# Patient Record
Sex: Female | Born: 1965 | Race: White | Hispanic: No | Marital: Married | Smoking: Never smoker
Health system: Southern US, Community
[De-identification: ages and names within clinical notes are randomized; demographics above are authoritative.]

---

## 1997-08-06 ENCOUNTER — Encounter: Admission: RE | Admit: 1997-08-06 | Discharge: 1997-08-06 | Payer: Self-pay | Admitting: Family Medicine

## 1997-08-12 ENCOUNTER — Ambulatory Visit (HOSPITAL_COMMUNITY): Admission: RE | Admit: 1997-08-12 | Discharge: 1997-08-12 | Payer: Self-pay | Admitting: Family Medicine

## 1997-08-16 ENCOUNTER — Encounter: Admission: RE | Admit: 1997-08-16 | Discharge: 1997-11-14 | Payer: Self-pay | Admitting: Family Medicine

## 1997-09-05 ENCOUNTER — Encounter: Admission: RE | Admit: 1997-09-05 | Discharge: 1997-09-05 | Payer: Self-pay | Admitting: Family Medicine

## 1997-10-20 ENCOUNTER — Emergency Department (HOSPITAL_COMMUNITY): Admission: EM | Admit: 1997-10-20 | Discharge: 1997-10-20 | Payer: Self-pay | Admitting: Emergency Medicine

## 1998-01-17 ENCOUNTER — Encounter: Admission: RE | Admit: 1998-01-17 | Discharge: 1998-01-17 | Payer: Self-pay | Admitting: Family Medicine

## 1998-01-18 ENCOUNTER — Encounter: Admission: RE | Admit: 1998-01-18 | Discharge: 1998-01-18 | Payer: Self-pay | Admitting: Family Medicine

## 1998-02-13 ENCOUNTER — Encounter: Admission: RE | Admit: 1998-02-13 | Discharge: 1998-02-13 | Payer: Self-pay | Admitting: Family Medicine

## 1998-05-24 ENCOUNTER — Encounter: Admission: RE | Admit: 1998-05-24 | Discharge: 1998-05-24 | Payer: Self-pay | Admitting: Family Medicine

## 1998-12-15 ENCOUNTER — Emergency Department (HOSPITAL_COMMUNITY): Admission: EM | Admit: 1998-12-15 | Discharge: 1998-12-15 | Payer: Self-pay | Admitting: Emergency Medicine

## 1999-01-29 ENCOUNTER — Encounter: Admission: RE | Admit: 1999-01-29 | Discharge: 1999-01-29 | Payer: Self-pay | Admitting: Family Medicine

## 1999-05-13 ENCOUNTER — Encounter: Admission: RE | Admit: 1999-05-13 | Discharge: 1999-05-13 | Payer: Self-pay | Admitting: Sports Medicine

## 1999-08-16 ENCOUNTER — Emergency Department (HOSPITAL_COMMUNITY): Admission: EM | Admit: 1999-08-16 | Discharge: 1999-08-16 | Payer: Self-pay | Admitting: Emergency Medicine

## 1999-08-16 ENCOUNTER — Encounter: Payer: Self-pay | Admitting: Emergency Medicine

## 2000-04-24 ENCOUNTER — Ambulatory Visit (HOSPITAL_BASED_OUTPATIENT_CLINIC_OR_DEPARTMENT_OTHER): Admission: RE | Admit: 2000-04-24 | Discharge: 2000-04-24 | Payer: Self-pay | Admitting: Otolaryngology

## 2001-05-25 ENCOUNTER — Ambulatory Visit (HOSPITAL_COMMUNITY): Admission: RE | Admit: 2001-05-25 | Discharge: 2001-05-25 | Payer: Self-pay | Admitting: Family Medicine

## 2001-05-25 ENCOUNTER — Encounter: Payer: Self-pay | Admitting: Family Medicine

## 2002-11-18 ENCOUNTER — Emergency Department (HOSPITAL_COMMUNITY): Admission: EM | Admit: 2002-11-18 | Discharge: 2002-11-18 | Payer: Self-pay | Admitting: Emergency Medicine

## 2007-08-18 ENCOUNTER — Ambulatory Visit: Payer: Self-pay

## 2007-09-19 ENCOUNTER — Ambulatory Visit: Payer: Self-pay | Admitting: Internal Medicine

## 2007-09-30 ENCOUNTER — Ambulatory Visit: Payer: Self-pay | Admitting: Internal Medicine

## 2007-10-19 ENCOUNTER — Ambulatory Visit: Payer: Self-pay | Admitting: Internal Medicine

## 2008-01-02 ENCOUNTER — Other Ambulatory Visit: Payer: Self-pay

## 2008-01-03 ENCOUNTER — Inpatient Hospital Stay: Payer: Self-pay | Admitting: Internal Medicine

## 2008-01-03 IMAGING — US ABDOMEN ULTRASOUND
1 series · 17 of 25 positions shown · non-contrast
Comparison: none

REASON FOR EXAM: RUQ pain
COMMENTS:

[Series 1: abdomen ultrasound · 17 of 48 slices shown]
[im 1/48]
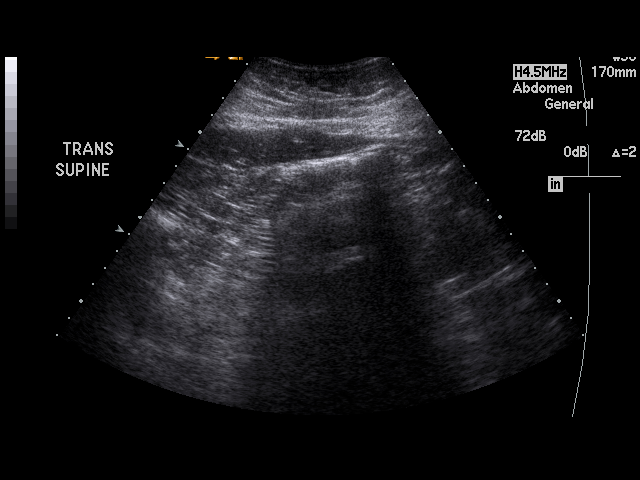
[im 4/48]
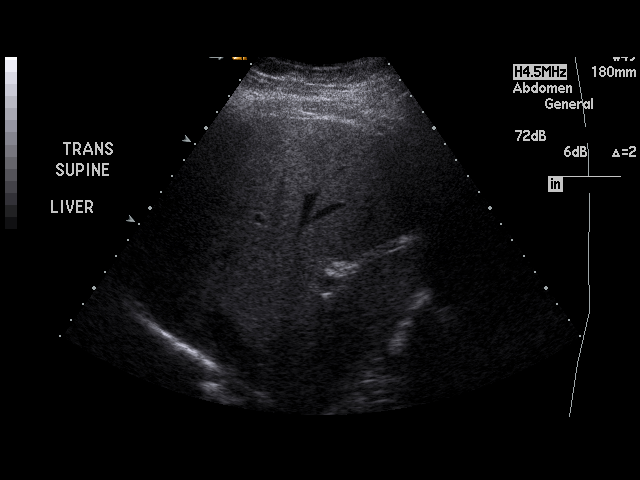
[im 6/48]
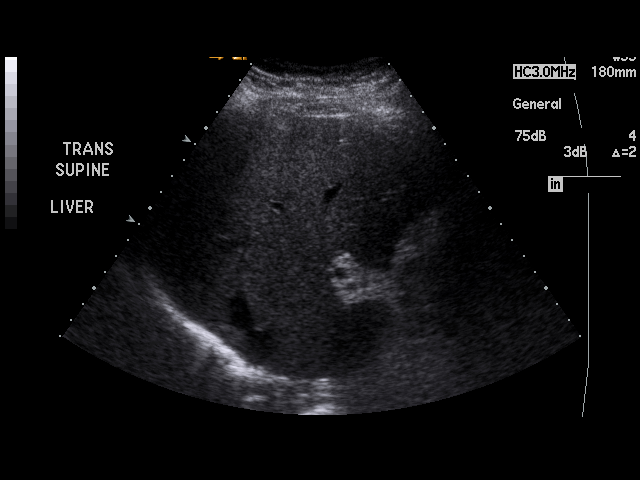
[im 10/48]
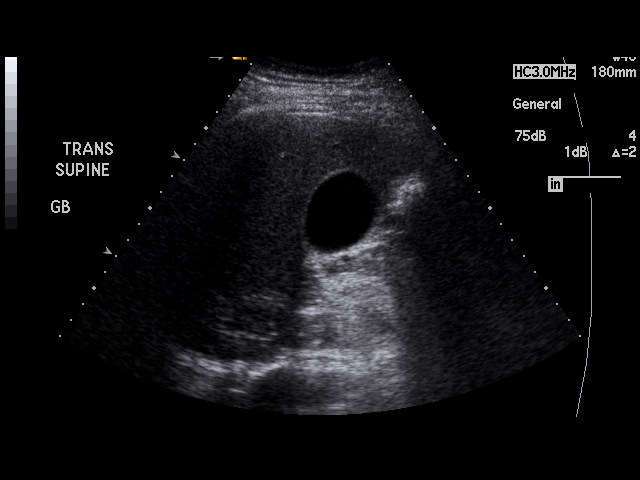
[im 12/48]
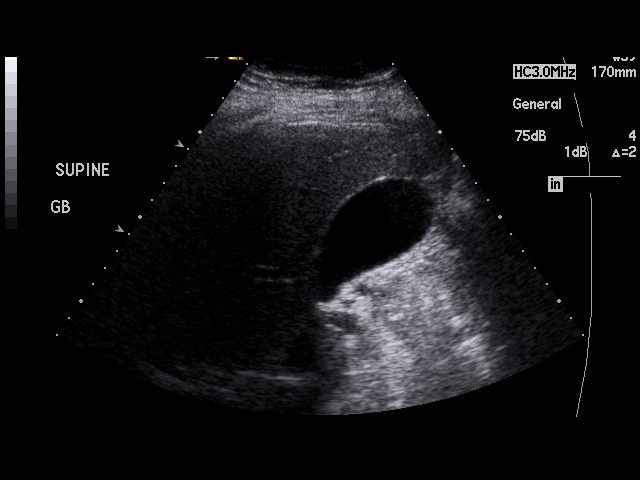
[im 16/48]
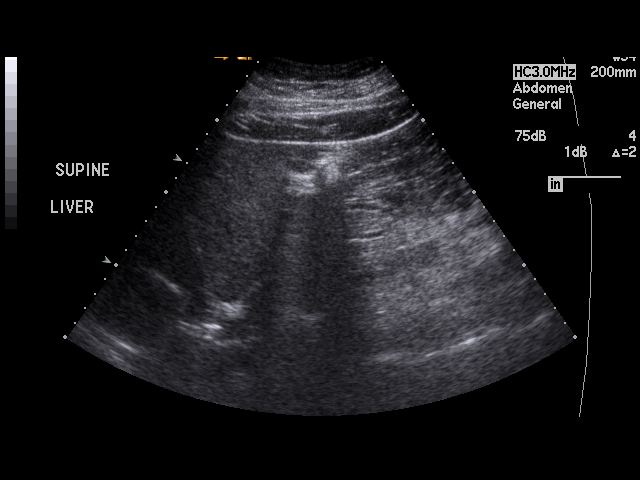
[im 18/48]
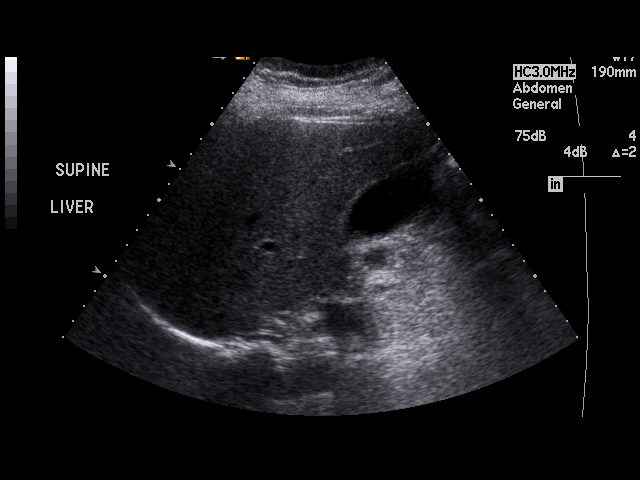
[im 22/48]
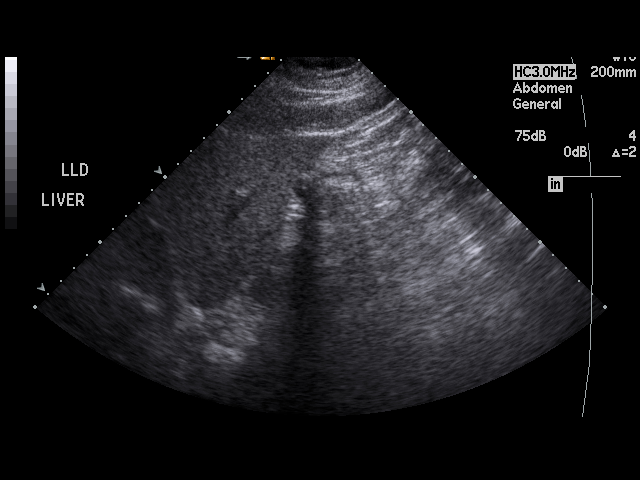
[im 24/48]
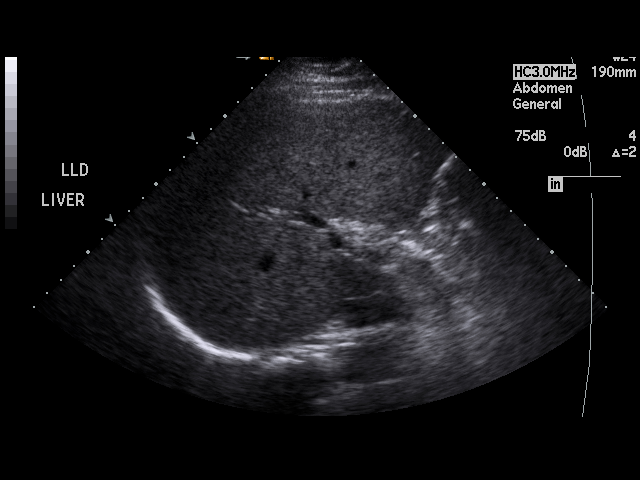
[im 26/48]
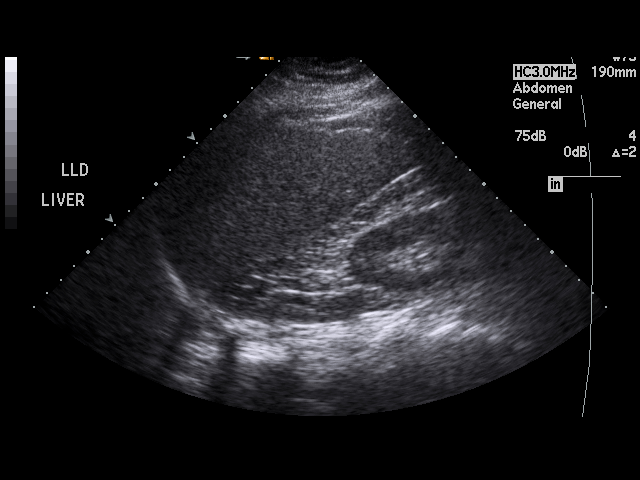
[im 30/48]
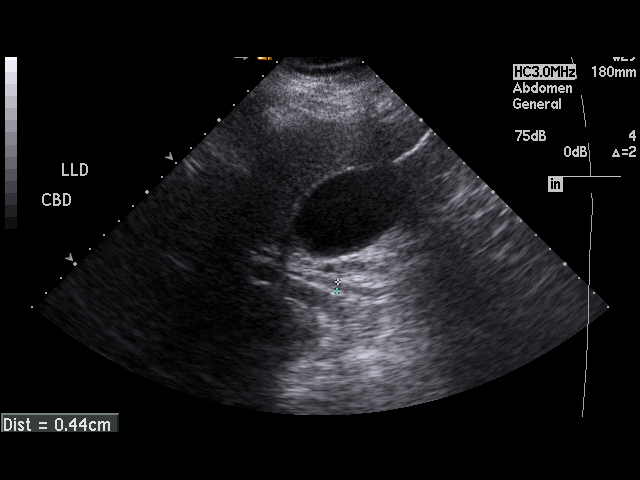
[im 32/48]
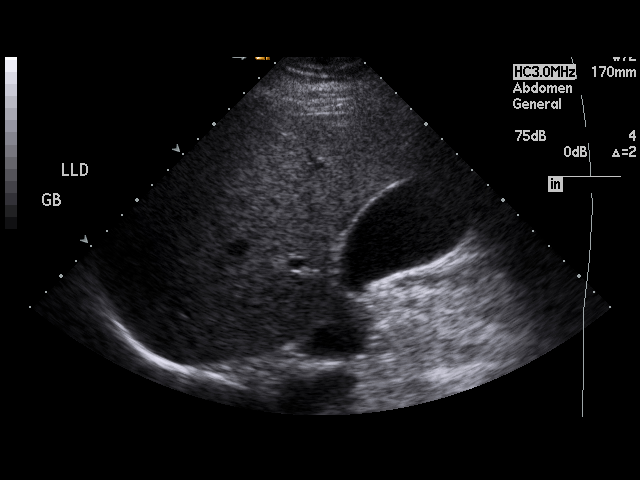
[im 36/48]
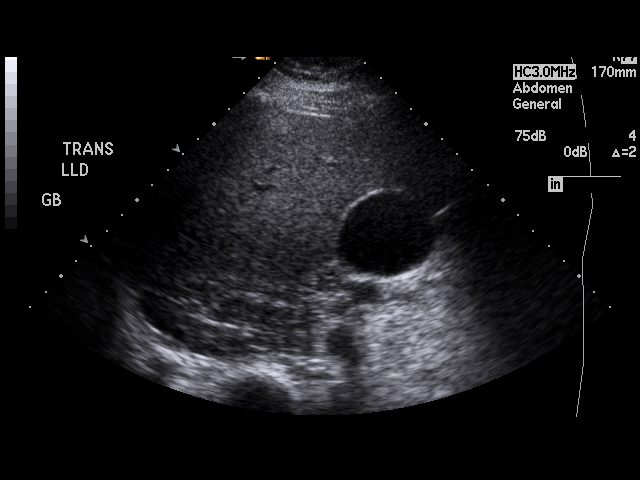
[im 38/48]
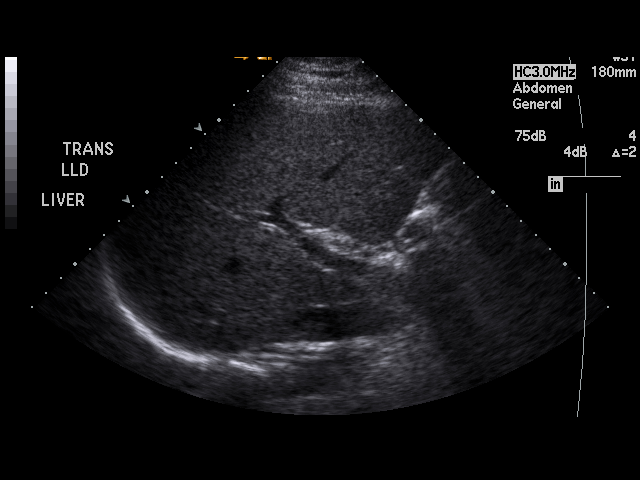
[im 42/48]
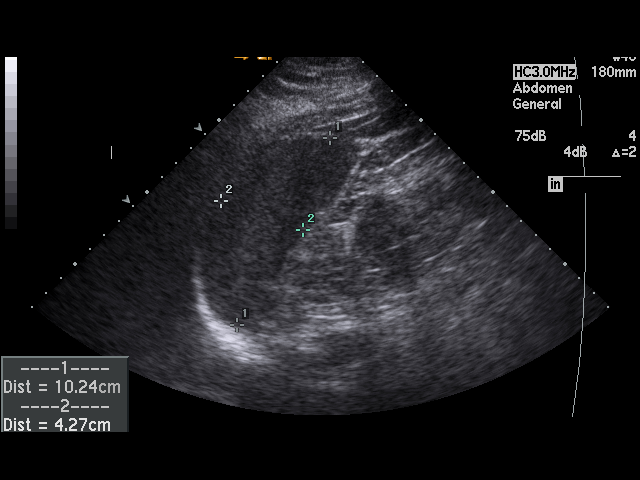
[im 44/48]
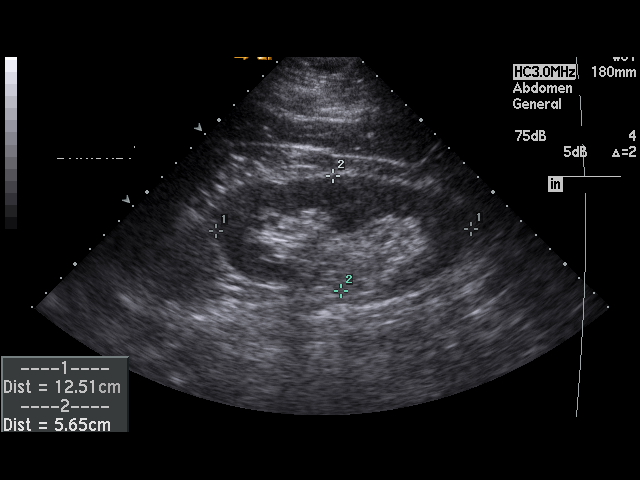
[im 48/48]
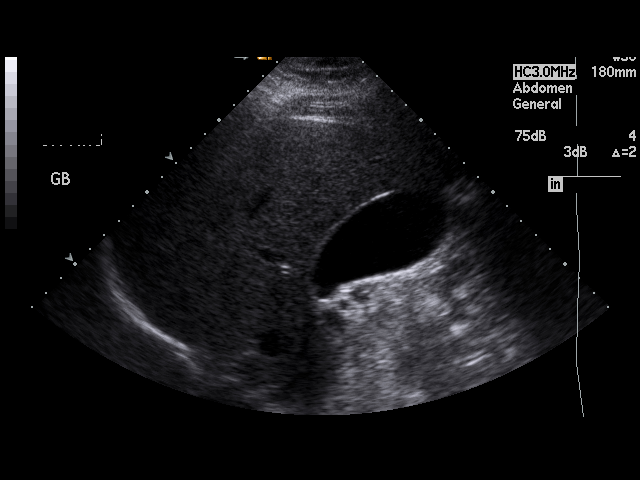

[17 of 25 positions shown; findings below may reference images not displayed]

PROCEDURE:     US  - US ABDOMEN GENERAL SURVEY  - [DATE] [DATE]

RESULT:     The liver demonstrates a homogeneous echotexture.  Hepatopetal
flow is demonstrated within the portal vein.  Evaluation of the gallbladder
demonstrates no evidence of pericholecystic fluid nor evidence of gallstones
nor sludging.  There is no evidence of intra or extrahepatic biliary ductal
dilatation.  Evaluation of the kidneys demonstrates no evidence of
hydronephrosis, masses or calculi.  The inferior vena cava and aorta are
unremarkable.  Evaluation of the kidneys demonstrates dimensions of 12.33 x
4.88 on the RIGHT and 12.51 x 5.65 on the LEFT.  There is no evidence of
hydronephrosis, masses nor calculi.  The pancreas is not visualized.
IMPRESSION: Unremarkable abdominal ultrasound as described above.

Dr. VS, of the emergency department, was informed of these findings at
the time of the initial interpretation.

## 2013-08-01 ENCOUNTER — Ambulatory Visit: Payer: Self-pay | Admitting: General Practice

## 2013-08-04 ENCOUNTER — Ambulatory Visit: Payer: Self-pay | Admitting: General Practice

## 2013-08-07 LAB — PATHOLOGY REPORT

## 2016-02-17 ENCOUNTER — Encounter: Payer: Self-pay | Admitting: Emergency Medicine

## 2016-02-17 ENCOUNTER — Emergency Department
Admission: EM | Admit: 2016-02-17 | Discharge: 2016-02-17 | Disposition: A | Discharge status: Home / Self Care | Payer: Self-pay | Attending: Emergency Medicine | Admitting: Emergency Medicine

## 2016-02-17 ENCOUNTER — Emergency Department: Payer: Self-pay

## 2016-02-17 DIAGNOSIS — B9789 Other viral agents as the cause of diseases classified elsewhere: Secondary | ICD-10-CM

## 2016-02-17 DIAGNOSIS — J069 Acute upper respiratory infection, unspecified: Secondary | ICD-10-CM | POA: Insufficient documentation

## 2016-02-17 MED ORDER — ALBUTEROL SULFATE HFA 108 (90 BASE) MCG/ACT IN AERS: 2.0000 | INHALATION_SPRAY | RESPIRATORY_TRACT | 0 refills | Status: DC | PRN

## 2016-02-17 MED ORDER — HYDROCOD POLST-CPM POLST ER 10-8 MG/5ML PO SUER: 5.0000 mL | Freq: Two times a day (BID) | ORAL | 0 refills | Status: DC

## 2016-02-17 MED ORDER — HYDROCOD POLST-CPM POLST ER 10-8 MG/5ML PO SUER
5.0000 mL | Freq: Once | ORAL | Status: AC
  Administered 2016-02-17: 5 mL via ORAL
  Filled 2016-02-17: qty 5

## 2016-02-17 NOTE — ED Provider Notes (Signed)
[facility] Emergency Department Provider Note   ____________________________________________   First MD Initiated Contact with Patient 02/17/16 [phone]     (approximate)  I have reviewed the triage vital signs and the nursing notes.   HISTORY  Chief Complaint Cough and Nasal Congestion    HPI Stephanie LangoVanessa P Livingston is a 50 y.o. female who presents to the ED from home with a chief complain of cough and runny nose.Reports onset of symptoms over a week ago. Had fever as high as 102.23F 3 days ago; none since. Describes cough productive of yellow sputum which is persistent and keeps her up at night. Symptoms associated with chest wall soreness. Patient reports she has tried various over-the-counter medications without relief of her symptoms. Denies associated shortness of breath, abdominal pain, nausea, vomiting, diarrhea. Denies recent travel or trauma.   Past medical history None  There are no active problems to display for this patient.   History reviewed. No pertinent surgical history.  Prior to Admission medications   Medication Sig Start Date End Date Taking? Authorizing Provider  albuterol (PROVENTIL HFA;VENTOLIN HFA) 108 (90 Base) MCG/ACT inhaler Inhale 2 puffs into the lungs every 4 (four) hours as needed for wheezing or shortness of breath. 02/17/16   Irean HongJade J Efe Fazzino, MD  chlorpheniramine-HYDROcodone (TUSSIONEX PENNKINETIC ER) 10-8 MG/5ML SUER Take 5 mLs by mouth 2 (two) times daily. 02/17/16   Irean HongJade J Yaris Ferrell, MD    Allergies Morphine and related and Inderal [propranolol]  No family history on file.  Social History Social History  Substance Use Topics  . Smoking status: Not on file  . Smokeless tobacco: Not on file  . Alcohol use Not on file  Nonsmoker  Review of Systems  Constitutional: No fever/chills. Eyes: No visual changes. ENT: No sore throat. Cardiovascular: Denies chest pain. Respiratory: Positive for persistent, productive cough. Denies  shortness of breath. Gastrointestinal: No abdominal pain.  No nausea, no vomiting.  No diarrhea.  No constipation. Genitourinary: Negative for dysuria. Musculoskeletal: Negative for back pain. Skin: Negative for rash. Neurological: Negative for headaches, focal weakness or numbness.  10-point ROS otherwise negative.  ____________________________________________   PHYSICAL EXAM:  VITAL SIGNS: ED Triage Vitals  Enc Vitals Group     BP 02/17/16 0308 (!) 154/101     Pulse Rate 02/17/16 0308 (!) 114     Resp 02/17/16 0308 20     Temp 02/17/16 0308 99.1 F (37.3 C)     Temp Source 02/17/16 0308 Oral     SpO2 --      Weight 02/17/16 0307 220 lb (99.8 kg)     Height 02/17/16 0307 5\' 4"  (1.626 m)     Head Circumference --      Peak Flow --      Pain Score 02/17/16 0305 0     Pain Loc --      Pain Edu? --      Excl. in GC? --     Constitutional: Alert and oriented. Well appearing and in mild acute distress. Eyes: Conjunctivae are normal. PERRL. EOMI. Head: Atraumatic. Nose: No congestion/rhinnorhea. Mouth/Throat: Mucous membranes are moist.  Oropharynx non-erythematous. Neck: No stridor.   Cardiovascular: Normal rate, regular rhythm. Grossly normal heart sounds.  Good peripheral circulation. Respiratory: Normal respiratory effort.  No retractions. Splinting. Lungs CTAB. Anterior chest wall tenderness to palpation. Gastrointestinal: Soft and nontender. No distention. No abdominal bruits. No CVA tenderness. Musculoskeletal: No lower extremity tenderness nor edema.  No joint effusions. Neurologic:  Normal speech  and language. No gross focal neurologic deficits are appreciated. No gait instability. Skin:  Skin is warm, dry and intact. No rash noted. Psychiatric: Mood and affect are normal. Speech and behavior are normal.  ____________________________________________   LABS (all labs ordered are listed, but only abnormal results are displayed)  Labs Reviewed - No data to  display ____________________________________________  EKG  None ____________________________________________  RADIOLOGY  Chest 2 view (viewed by me, interpreted per Dr. Clovis RileyMitchell): No active cardiopulmonary disease. ____________________________________________   PROCEDURES  Procedure(s) performed: None  Procedures  Critical Care performed: No  ____________________________________________   INITIAL IMPRESSION / ASSESSMENT AND PLAN / ED COURSE  Pertinent labs & imaging results that were available during my care of the patient were reviewed by me and considered in my medical decision making (see chart for details).  50 -year-old female who presents with over a week's worth of viral URI symptoms. X-rays negative for pneumonia. Will place on Tussionex and inhaler as needed; patient will follow-up with her PCP this week. Strict return precautions given. Patient verbalizes understanding and agrees with plan of care.  Clinical Course     ____________________________________________   FINAL CLINICAL IMPRESSION(S) / ED DIAGNOSES  Final diagnoses:  Viral URI with cough      NEW MEDICATIONS STARTED DURING THIS VISIT:  New Prescriptions   ALBUTEROL (PROVENTIL HFA;VENTOLIN HFA) 108 (90 BASE) MCG/ACT INHALER    Inhale 2 puffs into the lungs every 4 (four) hours as needed for wheezing or shortness of breath.   CHLORPHENIRAMINE-HYDROCODONE (TUSSIONEX PENNKINETIC ER) 10-8 MG/5ML SUER    Take 5 mLs by mouth 2 (two) times daily.     Note:  This document was prepared using Dragon voice recognition software and may include unintentional dictation errors.    Irean HongJade J Marquia Costello, MD 02/17/16 [phone]

## 2016-02-17 NOTE — Discharge Instructions (Signed)
1. You may take Tussionex as needed for cough. 2. Use albuterol inhaler 2 puffs every 4 hours as needed for wheezing. 3. Return to the ER for worsening symptoms, persistent vomiting, difficulty breathing or other concerns.

## 2016-02-17 NOTE — ED Notes (Signed)
Pt reports having cough and runny nose for over a week.  Pt reports having fever of 102.4 last Friday, but no fever since.  Pt states that she cannot get rid of her cough and that she cannot sleep because of it.  Pt states she has tried robitussin DM, tylenol, mucinex and dayquil to relieve symptoms with no relief.

## 2021-02-25 ENCOUNTER — Other Ambulatory Visit: Payer: Self-pay

## 2021-02-25 ENCOUNTER — Emergency Department: Payer: BC Managed Care – PPO | Admitting: Anesthesiology

## 2021-02-25 ENCOUNTER — Encounter
Admission: EM | Disposition: A | Discharge status: Home / Self Care | Payer: Self-pay | Source: Home / Self Care | Attending: Emergency Medicine

## 2021-02-25 ENCOUNTER — Emergency Department: Payer: BC Managed Care – PPO

## 2021-02-25 ENCOUNTER — Observation Stay
Admission: EM | Admit: 2021-02-25 | Discharge: 2021-02-26 | Disposition: A | Discharge status: Home / Self Care | Payer: BC Managed Care – PPO | Attending: Surgery | Admitting: Surgery

## 2021-02-25 DIAGNOSIS — K439 Ventral hernia without obstruction or gangrene: Secondary | ICD-10-CM | POA: Diagnosis not present

## 2021-02-25 DIAGNOSIS — Z886 Allergy status to analgesic agent status: Secondary | ICD-10-CM | POA: Insufficient documentation

## 2021-02-25 DIAGNOSIS — Z20822 Contact with and (suspected) exposure to covid-19: Secondary | ICD-10-CM | POA: Insufficient documentation

## 2021-02-25 DIAGNOSIS — K436 Other and unspecified ventral hernia with obstruction, without gangrene: Secondary | ICD-10-CM | POA: Diagnosis present

## 2021-02-25 HISTORY — PX: VENTRAL HERNIA REPAIR: SHX424

## 2021-02-25 HISTORY — PX: INSERTION OF MESH: SHX5868

## 2021-02-25 LAB — CBC WITH DIFFERENTIAL/PLATELET
Abs Immature Granulocytes: 0.05 10*3/uL (ref 0.00–0.07)
Basophils Absolute: 0.1 10*3/uL (ref 0.0–0.1)
Basophils Relative: 1 %
Eosinophils Absolute: 0.1 10*3/uL (ref 0.0–0.5)
Eosinophils Relative: 1 %
HCT: 43.3 % (ref 36.0–46.0)
Hemoglobin: 15.2 g/dL — ABNORMAL HIGH (ref 12.0–15.0)
Immature Granulocytes: 0 %
Lymphocytes Relative: 23 %
Lymphs Abs: 2.6 10*3/uL (ref 0.7–4.0)
MCH: 30.3 pg (ref 26.0–34.0)
MCHC: 35.1 g/dL (ref 30.0–36.0)
MCV: 86.4 fL (ref 80.0–100.0)
Monocytes Absolute: 1 10*3/uL (ref 0.1–1.0)
Monocytes Relative: 9 %
Neutro Abs: 7.3 10*3/uL (ref 1.7–7.7)
Neutrophils Relative %: 66 %
Platelets: 342 10*3/uL (ref 150–400)
RBC: 5.01 MIL/uL (ref 3.87–5.11)
RDW: 12.3 % (ref 11.5–15.5)
WBC: 11.2 10*3/uL — ABNORMAL HIGH (ref 4.0–10.5)
nRBC: 0 % (ref 0.0–0.2)

## 2021-02-25 LAB — COMPREHENSIVE METABOLIC PANEL
ALT: 65 U/L — ABNORMAL HIGH (ref 0–44)
AST: 60 U/L — ABNORMAL HIGH (ref 15–41)
Albumin: 3.7 g/dL (ref 3.5–5.0)
Alkaline Phosphatase: 73 U/L (ref 38–126)
Anion gap: 16 — ABNORMAL HIGH (ref 5–15)
BUN: 14 mg/dL (ref 6–20)
CO2: 19 mmol/L — ABNORMAL LOW (ref 22–32)
Calcium: 9.9 mg/dL (ref 8.9–10.3)
Chloride: 99 mmol/L (ref 98–111)
Creatinine, Ser: 0.57 mg/dL (ref 0.44–1.00)
GFR, Estimated: >60 mL/min (ref >60)
Glucose, Bld: 231 mg/dL — ABNORMAL HIGH (ref 70–99)
Potassium: 3.2 mmol/L — ABNORMAL LOW (ref 3.5–5.1)
Sodium: 134 mmol/L — ABNORMAL LOW (ref 135–145)
Total Bilirubin: 0.8 mg/dL (ref 0.3–1.2)
Total Protein: 7.8 g/dL (ref 6.5–8.1)

## 2021-02-25 LAB — TYPE AND SCREEN
ABO/RH(D): O POS
Antibody Screen: NEGATIVE

## 2021-02-25 LAB — RESP PANEL BY RT-PCR (FLU A&B, COVID) ARPGX2
Influenza A by PCR: NEGATIVE
Influenza B by PCR: NEGATIVE
SARS Coronavirus 2 by RT PCR: NEGATIVE

## 2021-02-25 LAB — LACTIC ACID, PLASMA: Lactic Acid, Venous: 6.3 mmol/L (ref 0.5–1.9)

## 2021-02-25 LAB — LIPASE, BLOOD: Lipase: 41 U/L (ref 11–51)

## 2021-02-25 SURGERY — REPAIR, HERNIA, VENTRAL
Anesthesia: General | Site: Abdomen

## 2021-02-25 MED ORDER — CEFAZOLIN SODIUM-DEXTROSE 2-4 GM/100ML-% IV SOLN
INTRAVENOUS | Status: AC
  Filled 2021-02-25: qty 100

## 2021-02-25 MED ORDER — IOHEXOL 300 MG/ML  SOLN
100.0000 mL | Freq: Once | INTRAMUSCULAR | Status: AC | PRN
  Administered 2021-02-25: 100 mL via INTRAVENOUS

## 2021-02-25 MED ORDER — LACTATED RINGERS IV SOLN: INTRAVENOUS | Status: DC | PRN

## 2021-02-25 MED ORDER — FENTANYL CITRATE (PF) 100 MCG/2ML IJ SOLN
INTRAMUSCULAR | Status: DC | PRN
  Administered 2021-02-25: 25 ug via INTRAVENOUS
  Administered 2021-02-25: 50 ug via INTRAVENOUS

## 2021-02-25 MED ORDER — ENOXAPARIN SODIUM 40 MG/0.4ML IJ SOSY
40.0000 mg | PREFILLED_SYRINGE | INTRAMUSCULAR | Status: DC
  Administered 2021-02-26: 40 mg via SUBCUTANEOUS
  Filled 2021-02-25: qty 0.4

## 2021-02-25 MED ORDER — MIDAZOLAM HCL 2 MG/2ML IJ SOLN
INTRAMUSCULAR | Status: AC
  Filled 2021-02-25: qty 2

## 2021-02-25 MED ORDER — HYDROMORPHONE HCL 1 MG/ML IJ SOLN
0.5000 mg | Freq: Once | INTRAMUSCULAR | Status: AC
  Administered 2021-02-25: 0.5 mg via INTRAVENOUS
  Filled 2021-02-25: qty 1

## 2021-02-25 MED ORDER — ONDANSETRON 4 MG PO TBDP: 4.0000 mg | ORAL_TABLET | Freq: Four times a day (QID) | ORAL | Status: DC | PRN

## 2021-02-25 MED ORDER — BUPIVACAINE LIPOSOME 1.3 % IJ SUSP
INTRAMUSCULAR | Status: AC
  Filled 2021-02-25: qty 20

## 2021-02-25 MED ORDER — ONDANSETRON HCL 4 MG/2ML IJ SOLN
INTRAMUSCULAR | Status: DC | PRN
  Administered 2021-02-25: 4 mg via INTRAVENOUS

## 2021-02-25 MED ORDER — FENTANYL CITRATE (PF) 100 MCG/2ML IJ SOLN
INTRAMUSCULAR | Status: AC
  Filled 2021-02-25: qty 2

## 2021-02-25 MED ORDER — GABAPENTIN 300 MG PO CAPS
300.0000 mg | ORAL_CAPSULE | Freq: Two times a day (BID) | ORAL | Status: DC
  Administered 2021-02-25 – 2021-02-26 (×2): 300 mg via ORAL
  Filled 2021-02-25 (×2): qty 1

## 2021-02-25 MED ORDER — SEVOFLURANE IN SOLN
RESPIRATORY_TRACT | Status: AC
  Filled 2021-02-25: qty 250

## 2021-02-25 MED ORDER — TRAMADOL HCL 50 MG PO TABS
ORAL_TABLET | ORAL | Status: AC
  Filled 2021-02-25: qty 1

## 2021-02-25 MED ORDER — CELECOXIB 200 MG PO CAPS
200.0000 mg | ORAL_CAPSULE | Freq: Two times a day (BID) | ORAL | Status: DC
  Administered 2021-02-25 – 2021-02-26 (×2): 200 mg via ORAL
  Filled 2021-02-25 (×3): qty 1

## 2021-02-25 MED ORDER — BUPIVACAINE-MELOXICAM ER 200-6 MG/7ML IJ SOLN
INTRAMUSCULAR | Status: AC
  Filled 2021-02-25: qty 1

## 2021-02-25 MED ORDER — PHENYLEPHRINE HCL (PRESSORS) 10 MG/ML IV SOLN
INTRAVENOUS | Status: DC | PRN
Start: 2021-02-25 — End: 2021-02-25
  Administered 2021-02-25: 200 ug via INTRAVENOUS

## 2021-02-25 MED ORDER — HYDROMORPHONE HCL 1 MG/ML IJ SOLN
1.0000 mg | Freq: Once | INTRAMUSCULAR | Status: AC
  Administered 2021-02-25: 1 mg via INTRAVENOUS
  Filled 2021-02-25: qty 1

## 2021-02-25 MED ORDER — BUPIVACAINE-MELOXICAM ER 200-6 MG/7ML IJ SOLN
INTRAMUSCULAR | Status: DC | PRN
  Administered 2021-02-25: 200 mg

## 2021-02-25 MED ORDER — SUGAMMADEX SODIUM 200 MG/2ML IV SOLN
INTRAVENOUS | Status: DC | PRN
  Administered 2021-02-25: 200 mg via INTRAVENOUS

## 2021-02-25 MED ORDER — ONDANSETRON HCL 4 MG/2ML IJ SOLN
INTRAMUSCULAR | Status: AC
  Filled 2021-02-25: qty 2

## 2021-02-25 MED ORDER — FENTANYL CITRATE (PF) 100 MCG/2ML IJ SOLN
INTRAMUSCULAR | Status: AC
  Administered 2021-02-25: 25 ug via INTRAVENOUS
  Filled 2021-02-25: qty 2

## 2021-02-25 MED ORDER — PROPOFOL 10 MG/ML IV BOLUS
INTRAVENOUS | Status: DC | PRN
  Administered 2021-02-25: 140 mg via INTRAVENOUS

## 2021-02-25 MED ORDER — TRAMADOL HCL 50 MG PO TABS
50.0000 mg | ORAL_TABLET | Freq: Four times a day (QID) | ORAL | Status: DC | PRN
  Administered 2021-02-25: 50 mg via ORAL

## 2021-02-25 MED ORDER — LACTATED RINGERS IV BOLUS
1000.0000 mL | Freq: Once | INTRAVENOUS | Status: AC
  Administered 2021-02-25: 1000 mL via INTRAVENOUS

## 2021-02-25 MED ORDER — ROCURONIUM BROMIDE 100 MG/10ML IV SOLN
INTRAVENOUS | Status: DC | PRN
  Administered 2021-02-25: 50 mg via INTRAVENOUS
  Administered 2021-02-25: 30 mg via INTRAVENOUS

## 2021-02-25 MED ORDER — FENTANYL CITRATE (PF) 100 MCG/2ML IJ SOLN
25.0000 ug | INTRAMUSCULAR | Status: DC | PRN
  Administered 2021-02-25 (×3): 25 ug via INTRAVENOUS

## 2021-02-25 MED ORDER — BUPIVACAINE HCL (PF) 0.5 % IJ SOLN
INTRAMUSCULAR | Status: AC
  Filled 2021-02-25: qty 30

## 2021-02-25 MED ORDER — SUCCINYLCHOLINE CHLORIDE 200 MG/10ML IV SOSY
PREFILLED_SYRINGE | INTRAVENOUS | Status: AC
  Filled 2021-02-25: qty 10

## 2021-02-25 MED ORDER — LIDOCAINE HCL (CARDIAC) PF 100 MG/5ML IV SOSY
PREFILLED_SYRINGE | INTRAVENOUS | Status: DC | PRN
  Administered 2021-02-25: 60 mg via INTRAVENOUS

## 2021-02-25 MED ORDER — SUCCINYLCHOLINE CHLORIDE 200 MG/10ML IV SOSY
PREFILLED_SYRINGE | INTRAVENOUS | Status: DC | PRN
  Administered 2021-02-25: 120 mg via INTRAVENOUS

## 2021-02-25 MED ORDER — MIDAZOLAM HCL 2 MG/2ML IJ SOLN
INTRAMUSCULAR | Status: DC | PRN
  Administered 2021-02-25: 2 mg via INTRAVENOUS

## 2021-02-25 MED ORDER — MEPERIDINE HCL 25 MG/ML IJ SOLN
INTRAMUSCULAR | Status: AC
  Administered 2021-02-25: 12.5 mg via INTRAVENOUS
  Filled 2021-02-25: qty 1

## 2021-02-25 MED ORDER — ONDANSETRON HCL 4 MG/2ML IJ SOLN
4.0000 mg | Freq: Once | INTRAMUSCULAR | Status: AC | PRN
  Administered 2021-02-25: 4 mg via INTRAVENOUS

## 2021-02-25 MED ORDER — 0.9 % SODIUM CHLORIDE (POUR BTL) OPTIME
TOPICAL | Status: DC | PRN
  Administered 2021-02-25: 1000 mL

## 2021-02-25 MED ORDER — ROCURONIUM BROMIDE 10 MG/ML (PF) SYRINGE
PREFILLED_SYRINGE | INTRAVENOUS | Status: AC
  Filled 2021-02-25: qty 10

## 2021-02-25 MED ORDER — DEXAMETHASONE SODIUM PHOSPHATE 10 MG/ML IJ SOLN
INTRAMUSCULAR | Status: DC | PRN
  Administered 2021-02-25: 10 mg via INTRAVENOUS

## 2021-02-25 MED ORDER — ACETAMINOPHEN 10 MG/ML IV SOLN
INTRAVENOUS | Status: DC | PRN
  Administered 2021-02-25: 1000 mg via INTRAVENOUS

## 2021-02-25 MED ORDER — ACETAMINOPHEN 325 MG PO TABS
650.0000 mg | ORAL_TABLET | Freq: Four times a day (QID) | ORAL | Status: DC | PRN
  Administered 2021-02-26: 650 mg via ORAL
  Filled 2021-02-25: qty 2

## 2021-02-25 MED ORDER — CEFAZOLIN SODIUM-DEXTROSE 2-4 GM/100ML-% IV SOLN
2.0000 g | Freq: Once | INTRAVENOUS | Status: AC
  Administered 2021-02-25: 2 g via INTRAVENOUS

## 2021-02-25 MED ORDER — PROPOFOL 10 MG/ML IV BOLUS
INTRAVENOUS | Status: AC
  Filled 2021-02-25: qty 20

## 2021-02-25 MED ORDER — LIDOCAINE HCL (PF) 2 % IJ SOLN
INTRAMUSCULAR | Status: AC
  Filled 2021-02-25: qty 5

## 2021-02-25 MED ORDER — SODIUM CHLORIDE 0.9 % IV SOLN: INTRAVENOUS | Status: DC

## 2021-02-25 MED ORDER — MEPERIDINE HCL 25 MG/ML IJ SOLN: 12.5000 mg | Freq: Once | INTRAMUSCULAR | Status: AC

## 2021-02-25 MED ORDER — ONDANSETRON HCL 4 MG/2ML IJ SOLN: 4.0000 mg | Freq: Four times a day (QID) | INTRAMUSCULAR | Status: DC | PRN

## 2021-02-25 SURGICAL SUPPLY — 43 items
APL PRP STRL LF DISP 70% ISPRP (MISCELLANEOUS) ×4
BLADE CLIPPER SURG (BLADE) ×3 IMPLANT
BLADE SURG 15 STRL LF DISP TIS (BLADE) ×2 IMPLANT
BLADE SURG 15 STRL SS (BLADE) ×3
CHLORAPREP W/TINT 26 (MISCELLANEOUS) ×4 IMPLANT
DRAPE LAPAROTOMY 100X77 ABD (DRAPES) ×3 IMPLANT
DRSG OPSITE POSTOP 4X8 (GAUZE/BANDAGES/DRESSINGS) ×1 IMPLANT
ELECT BLADE 6.5 EXT (BLADE) ×3 IMPLANT
ELECT CAUTERY BLADE 6.4 (BLADE) ×3 IMPLANT
ELECT REM PT RETURN 9FT ADLT (ELECTROSURGICAL) ×3
ELECTRODE REM PT RTRN 9FT ADLT (ELECTROSURGICAL) ×2 IMPLANT
GLOVE SURG SYN 6.5 ES PF (GLOVE) ×3 IMPLANT
GLOVE SURG SYN 6.5 PF PI (GLOVE) ×2 IMPLANT
GLOVE SURG UNDER POLY LF SZ7 (GLOVE) ×3 IMPLANT
GOWN STRL REUS W/ TWL LRG LVL3 (GOWN DISPOSABLE) ×6 IMPLANT
GOWN STRL REUS W/TWL LRG LVL3 (GOWN DISPOSABLE) ×6
KIT TURNOVER KIT A (KITS) ×3 IMPLANT
LABEL OR SOLS (LABEL) ×3 IMPLANT
MANIFOLD NEPTUNE II (INSTRUMENTS) ×3 IMPLANT
MESH VENTRALEX ST 8CM LRG (Mesh General) ×1 IMPLANT
NEEDLE HYPO 22GX1.5 SAFETY (NEEDLE) ×6 IMPLANT
NS IRRIG 1000ML POUR BTL (IV SOLUTION) ×3 IMPLANT
PACK BASIN MAJOR ARMC (MISCELLANEOUS) ×3 IMPLANT
PACK BASIN MINOR ARMC (MISCELLANEOUS) ×3 IMPLANT
SEALER TISSUE X1 CVD JAW (INSTRUMENTS) ×1 IMPLANT
SPONGE T-LAP 18X18 ~~LOC~~+RFID (SPONGE) ×6 IMPLANT
STAPLER SKIN PROX 35W (STAPLE) ×1 IMPLANT
SUT ETHIBOND NAB MO 7 #0 18IN (SUTURE) ×3 IMPLANT
SUT PDS AB 1 TP1 54 (SUTURE) ×6 IMPLANT
SUT PROLENE 3 0 PS 2 (SUTURE) ×2 IMPLANT
SUT SILK 2 0 (SUTURE) ×6
SUT SILK 2-0 18XBRD TIE 12 (SUTURE) IMPLANT
SUT SILK 3 0 (SUTURE) ×6
SUT SILK 3-0 (SUTURE) ×3 IMPLANT
SUT SILK 3-0 18XBRD TIE 12 (SUTURE) IMPLANT
SUT VIC AB 0 CT1 18XCR BRD 8 (SUTURE) IMPLANT
SUT VIC AB 0 CT1 8-18 (SUTURE) ×3
SUT VIC AB 3-0 SH 27 (SUTURE) ×15
SUT VIC AB 3-0 SH 27X BRD (SUTURE) ×4 IMPLANT
SYR 10ML LL (SYRINGE) ×6 IMPLANT
SYR 20ML LL LF (SYRINGE) ×4 IMPLANT
WATER STERILE IRR 1000ML POUR (IV SOLUTION) ×3 IMPLANT
WATER STERILE IRR 500ML POUR (IV SOLUTION) ×3 IMPLANT

## 2021-02-25 NOTE — ED Triage Notes (Signed)
Pt presents to ER via ems with c/o abd pain.  Pt states around 1am she started having vomiting, and around 4am she started having some lower abd pain.  Pt then had a syncopal episode at work between 5 and 6 which is when ems was called.  Pt has rigid lump noted to lower abdomen.  Pt denies hx of hernias.  Pt does state she does some heavy moving at work.  Pt A&O x4 at this time.  Pt received 8 mg zofran w/ems.

## 2021-02-25 NOTE — H&P (Signed)
Subjective:   CC: strangulated incisional hernia  HPI:  Stephanie Livingston is a 55 y.o. female who was consulted by [facility] for issue above.  Symptoms were first noted a few hours ago. Pain is sharp, confined to the ventral area and associated lump, without radiation.  Associated with N/V, exacerbated by touch     Past Medical History: none reported  Past Surgical History: lap endometriosis  Family History: reviewed and not relevant to CC  Social History: no reported smoking, drug use, alcohol  Current Medications:  Prior to Admission medications   Medication Sig Start Date End Date Taking? Authorizing Provider  albuterol (PROVENTIL HFA;VENTOLIN HFA) 108 (90 Base) MCG/ACT inhaler Inhale 2 puffs into the lungs every 4 (four) hours as needed for wheezing or shortness of breath. 02/17/16   Irean Hong, MD  chlorpheniramine-HYDROcodone (TUSSIONEX PENNKINETIC ER) 10-8 MG/5ML SUER Take 5 mLs by mouth 2 (two) times daily. 02/17/16   Irean Hong, MD    Allergies:  Allergies as of 02/25/2021 - Review Complete 02/25/2021  Allergen Reaction Noted   Morphine and related Anaphylaxis 02/17/2016   Inderal [propranolol] Other (See Comments) 02/17/2016    ROS:  General: Denies weight loss, weight gain, fatigue, fevers, chills, and night sweats. Eyes: Denies blurry vision, double vision, eye pain, itchy eyes, and tearing. Ears: Denies hearing loss, earache, and ringing in ears. Nose: Denies sinus pain, congestion, infections, runny nose, and nosebleeds. Mouth/throat: Denies hoarseness, sore throat, bleeding gums, and difficulty swallowing. Heart: Denies chest pain, palpitations, racing heart, irregular heartbeat, leg pain or swelling, and decreased activity tolerance. Respiratory: Denies breathing difficulty, shortness of breath, wheezing, cough, and sputum. GI: Denies change in appetite, heartburn, constipation, diarrhea, and blood in stool. GU: Denies difficulty urinating, pain with urinating,  urgency, frequency, blood in urine. Musculoskeletal: Denies joint stiffness, pain, swelling, muscle weakness. Skin: Denies rash, itching, mass, tumors, sores, and boils Neurologic: Denies headache, fainting, dizziness, seizures, numbness, and tingling. Psychiatric: Denies depression, anxiety, difficulty sleeping, and memory loss. Endocrine: Denies heat or cold intolerance, and increased thirst or urination. Blood/lymph: Denies easy bruising, easy bruising, and swollen glands     Objective:     BP (!) 186/99   Pulse 65   Temp (!) 97.5 F (36.4 C) (Oral)   Resp 12   Ht 5\' 4"  (1.626 m)   Wt 99.8 kg   SpO2 94%   BMI 37.76 kg/m   Constitutional :  alert, cooperative, appears stated age, and mild distress  Lymphatics/Throat:  no asymmetry, masses, or scars  Respiratory:  clear to auscultation bilaterally  Cardiovascular:  regular rate and rhythm  Gastrointestinal: Soft, no guarding, TTP over ventral hernia .   Musculoskeletal: Steady movement  Skin: Cool and moist, visible surgical scars   Psychiatric: Normal affect, non-agitated, not confused       LABS:  CMP Latest Ref Rng & Units 02/25/2021  Glucose 70 - 99 mg/dL 13/11/2020)  BUN 6 - 20 mg/dL 14  Creatinine 109(N - 2.35 mg/dL 5.73  Sodium 2.20 - 254 mmol/L 134(L)  Potassium 3.5 - 5.1 mmol/L 3.2(L)  Chloride 98 - 111 mmol/L 99  CO2 22 - 32 mmol/L 19(L)  Calcium 8.9 - 10.3 mg/dL 9.9  Total Protein 6.5 - 8.1 g/dL 7.8  Total Bilirubin 0.3 - 1.2 mg/dL 0.8  Alkaline Phos 38 - 126 U/L 73  AST 15 - 41 U/L 60(H)  ALT 0 - 44 U/L 65(H)   CBC Latest Ref Rng & Units 02/25/2021  WBC  4.0 - 10.5 K/uL 11.2(H)  Hemoglobin 12.0 - 15.0 g/dL 15.2(H)  Hematocrit 36.0 - 46.0 % 43.3  Platelets 150 - 400 K/uL 342    RADS: CLINICAL DATA:  clinical ventral hernia, incarcerated, eval SBO   EXAM: CT ABDOMEN AND PELVIS WITH CONTRAST   TECHNIQUE: Multidetector CT imaging of the abdomen and pelvis was performed using the standard protocol  following bolus administration of intravenous contrast.   CONTRAST:  158mL OMNIPAQUE IOHEXOL 300 MG/ML  SOLN   COMPARISON:  CT abdomen pelvis 01/03/2008   FINDINGS: Lower chest: Bibasilar hypoventilatory changes. No acute abnormality.   Hepatobiliary: No focal liver abnormality is seen. The gallbladder is unremarkable.   Pancreas: Unremarkable. No pancreatic ductal dilatation or surrounding inflammatory changes.   Spleen: Normal in size without focal abnormality.   Adrenals/Urinary Tract: Adrenal glands are unremarkable. No hydronephrosis or nephrolithiasis. Tiny left renal cyst. Bladder is unremarkable.   Stomach/Bowel: The stomach is within normal limits. There is a ventral hernia containing fluid-filled distended small bowel and fat with a mild amount of inflammatory stranding. There is a mild amount of mesenteric edema. The intraperitoneal small bowel is decompressed prior to the hernia injury and after the hernia exit. The hernia aperture measures approximately 3.8 cm. The appendix is normal.   Vascular/Lymphatic: No significant vascular findings are present. No enlarged abdominal or pelvic lymph nodes.   Reproductive: Unremarkable.   Other: Ventral hernia as described above. No abdominopelvic ascites. No free air.   Musculoskeletal: No acute osseous abnormality. Lumbarized S1 with rudimentary disc at S1-S2. There is moderate degenerative disc disease at L5-S1. There is lower lumbar predominant bilateral facet arthropathy. No suspicious lytic or blastic lesions.   IMPRESSION: Ventral hernia containing likely obstructed small bowel with mild mesenteric stranding, concerning for an incarcerated hernia. Correlate with reducibility.     Electronically Signed   By: Maurine Simmering M.D.   On: 02/25/2021 08:16   Assessment:   Incarcerated ventral hernia with elevated lactate, unable to be reduced at bedside. Clinically concerning for strangulation based on timeline  and physical exam.  Plan:     Recommend emergent surgery. The risk of surgery include, but not limited to, recurrence, bleeding, chronic pain, post-op infxn, post-op SBO or ileus, hernias, resection of bowel, re-anastamosis, possible ostomy placement and need for re-operation to address said risks. The risks of general anesthetic, if used, includes MI, CVA, sudden death or even reaction to anesthetic medications also discussed. Alternatives include continued observation and NG decompression.  Benefits include possible symptom relief, preventing further decline in health and possible death.  Typical post-op recovery time of additional days in hospital for observation afterwards also discussed.  The patient verbalized understanding and all questions were answered to the patient's satisfaction.  To OR emergently for repair, possible bowel resection

## 2021-02-25 NOTE — Anesthesia Preprocedure Evaluation (Signed)
Anesthesia Evaluation  Patient identified by MRN, date of birth, ID band Patient awake    Reviewed: Allergy & Precautions, H&P , NPO status , Patient's Chart, lab work & pertinent test results, reviewed documented beta blocker date and time   Airway Mallampati: II  TM Distance: >3 FB Neck ROM: full    Dental  (+) Teeth Intact   Pulmonary neg pulmonary ROS,    Pulmonary exam normal        Cardiovascular Exercise Tolerance: Good negative cardio ROS Normal cardiovascular exam Rhythm:regular Rate:Normal     Neuro/Psych negative neurological ROS  negative psych ROS   GI/Hepatic negative GI ROS, Neg liver ROS,   Endo/Other  negative endocrine ROS  Renal/GU negative Renal ROS  negative genitourinary   Musculoskeletal   Abdominal   Peds  Hematology negative hematology ROS (+)   Anesthesia Other Findings History reviewed. No pertinent past medical history. History reviewed. No pertinent surgical history. BMI    Body Mass Index: 37.76 kg/m     Reproductive/Obstetrics negative OB ROS                             Anesthesia Physical Anesthesia Plan  ASA: 2  Anesthesia Plan: General ETT   Post-op Pain Management:    Induction:   PONV Risk Score and Plan:   Airway Management Planned:   Additional Equipment:   Intra-op Plan:   Post-operative Plan:   Informed Consent: I have reviewed the patients History and Physical, chart, labs and discussed the procedure including the risks, benefits and alternatives for the proposed anesthesia with the patient or authorized representative who has indicated his/her understanding and acceptance.     Dental Advisory Given  Plan Discussed with: CRNA  Anesthesia Plan Comments:         Anesthesia Quick Evaluation

## 2021-02-25 NOTE — ED Notes (Signed)
Patient transported to CT 

## 2021-02-25 NOTE — Anesthesia Procedure Notes (Signed)
Procedure Name: Intubation Date/Time: 02/25/2021 9:36 AM Performed by: Ginger Carne, CRNA Pre-anesthesia Checklist: Patient identified, Emergency Drugs available, Suction available, Patient being monitored and Timeout performed Patient Re-evaluated:Patient Re-evaluated prior to induction Oxygen Delivery Method: Circle system utilized Preoxygenation: Pre-oxygenation with 100% oxygen Induction Type: IV induction, Rapid sequence and Cricoid Pressure applied Laryngoscope Size: McGraph and 4 Grade View: Grade III Tube type: Oral Tube size: 7.0 mm Number of attempts: 1 Airway Equipment and Method: Stylet and Video-laryngoscopy Placement Confirmation: ETT inserted through vocal cords under direct vision, positive ETCO2 and breath sounds checked- equal and bilateral Secured at: 22 cm Tube secured with: Tape Dental Injury: Teeth and Oropharynx as per pre-operative assessment  Difficulty Due To: Difficulty was anticipated, Difficult Airway- due to limited oral opening and Difficult Airway- due to large tongue Future Recommendations: Recommend- induction with short-acting agent, and alternative techniques readily available

## 2021-02-25 NOTE — ED Provider Notes (Signed)
-----------------------------------------   7:09 AM on 02/25/2021 -----------------------------------------  Blood pressure (!) 177/84, pulse 61, temperature (!) 97.5 F (36.4 C), temperature source Oral, resp. rate 18, height 5\' 4"  (1.626 m), weight 99.8 kg, SpO2 97 %.  Assuming care from Dr. .  In short, Stephanie Livingston is a 55 y.o. female with a chief complaint of Abdominal Pain .  Refer to the original H&P for additional details.  The current plan of care is to follow-up CT results and discuss apparent incarcerated ventral hernia with general surgery.  ----------------------------------------- 8:26 AM on 02/25/2021 ----------------------------------------- Patient evaluated by general surgery, who will plan to take her to the OR emergently due to concern for incarcerated ventral hernia.  Patient with ongoing pain and was given additional dose of IV Dilaudid, lactic acid also noted to be elevated and we will continue IV fluid hydration.     13/11/2020, MD 02/25/21 [phone]

## 2021-02-25 NOTE — Transfer of Care (Signed)
Immediate Anesthesia Transfer of Care Note  Patient: Stephanie Livingston  Procedure(s) Performed: HERNIA REPAIR VENTRAL ADULT INSERTION OF MESH  Patient Location: PACU  Anesthesia Type:General  Level of Consciousness: sedated and drowsy  Airway & Oxygen Therapy: Patient Spontanous Breathing and Patient connected to nasal cannula oxygen  Post-op Assessment: Report given to RN and Post -op Vital signs reviewed and stable  Post vital signs: Reviewed and stable  Last Vitals:  Vitals Value Taken Time  BP 144/83 02/25/21 1144  Temp    Pulse 87 02/25/21 1145  Resp 26 02/25/21 1145  SpO2 91 % 02/25/21 1145  Vitals shown include unvalidated device data.  Last Pain:  Vitals:   02/25/21 0905  TempSrc: Oral  PainSc: 8          Complications: No notable events documented.

## 2021-02-25 NOTE — ED Provider Notes (Signed)
[facility] Emergency Department Provider Note ____________________________________________   Event Date/Time   First MD Initiated Contact with Patient 02/25/21 [phone]     (approximate)  I have reviewed the triage vital signs and the nursing notes.  HISTORY  Chief Complaint Abdominal Pain   HPI Stephanie Livingston is a 55 y.o. femalewho presents to the ED for evaluation of abd pain and emesis.   Chart review indicates no relevant hx.  Patient self-reports a hysterectomy and multiple laparoscopic procedures for endometriosis in the past.  She presents to the ED for evaluation of 2 hours of severe periumbilical abdominal pain and emesis.  She reports 10/10 pain for the past 1 or 2 hours that started suddenly while at work.  She reports recurrent emesis associated with this.  Pain is sharp and nonradiating.  Denies fevers, falls or trauma.  Denies urinary symptoms. Reports rapid onset nausea, diarrhea, then sudden severe pain that made her black out.  Patient crying out and begging for pain medications.  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   History reviewed. No pertinent past medical history.  There are no problems to display for this patient.   History reviewed. No pertinent surgical history.  Prior to Admission medications   Medication Sig Start Date End Date Taking? Authorizing Provider  albuterol (PROVENTIL HFA;VENTOLIN HFA) 108 (90 Base) MCG/ACT inhaler Inhale 2 puffs into the lungs every 4 (four) hours as needed for wheezing or shortness of breath. 02/17/16   Irean Hong, MD  chlorpheniramine-HYDROcodone (TUSSIONEX PENNKINETIC ER) 10-8 MG/5ML SUER Take 5 mLs by mouth 2 (two) times daily. 02/17/16   Irean Hong, MD    Allergies Morphine and related and Inderal [propranolol]  History reviewed. No pertinent family history.  Social History    Review of Systems  Constitutional: No fever/chills Eyes: No visual changes. ENT: No sore  throat. Cardiovascular: Denies chest pain. Respiratory: Denies shortness of breath. Gastrointestinal: Positive for abdominal pain, nausea and vomiting.  No diarrhea.  No constipation. Genitourinary: Negative for dysuria. Musculoskeletal: Negative for back pain. Skin: Negative for rash. Neurological: Negative for headaches, focal weakness or numbness. ____________________________________________   PHYSICAL EXAM:  VITAL SIGNS: Vitals:   02/25/21 0645 02/25/21 0700  BP: (!) 174/78 (!) 177/84  Pulse: 60 61  Resp: 20 18  Temp:    SpO2: 98% 97%     Constitutional: Alert and oriented.  Obviously uncomfortable, crying out.  Answers my questions and follows simple commands.  Obese. Eyes: Conjunctivae are normal. PERRL. EOMI. Head: Atraumatic. Nose: No congestion/rhinnorhea. Mouth/Throat: Mucous membranes are moist.  Oropharynx non-erythematous. Neck: No stridor. No cervical spine tenderness to palpation. Cardiovascular: Normal rate, regular rhythm. Grossly normal heart sounds.  Good peripheral circulation. Respiratory: Normal respiratory effort.  No retractions. Lungs CTAB. Gastrointestinal: Soft , nondistended.  Left-sided periumbilical firmness that is exquisitely tender without overlying skin changes or signs of trauma. Unable to reduce.  Lesser tenderness to the rest of the abd.  Musculoskeletal: No lower extremity tenderness nor edema.  No joint effusions. No signs of acute trauma. Neurologic:  Normal speech and language. No gross focal neurologic deficits are appreciated. No gait instability noted. Skin:  Skin is warm, dry and intact. No rash noted. Psychiatric: Mood and affect are normal. Speech and behavior are normal.  ____________________________________________   LABS (all labs ordered are listed, but only abnormal results are displayed)  Labs Reviewed  CBC WITH DIFFERENTIAL/PLATELET - Abnormal; Notable for the following components:  Result Value   WBC 11.2 (*)     Hemoglobin 15.2 (*)    All other components within normal limits  LACTIC ACID, PLASMA - Abnormal; Notable for the following components:   Lactic Acid, Venous 6.3 (*)    All other components within normal limits  RESP PANEL BY RT-PCR (FLU A&B, COVID) ARPGX2  COMPREHENSIVE METABOLIC PANEL  URINALYSIS, COMPLETE (UACMP) WITH MICROSCOPIC  LACTIC ACID, PLASMA  LIPASE, BLOOD  TYPE AND SCREEN   ____________________________________________  12 Lead EKG  Poor baseline.  Sinus rhythm, rate of 70 bpm.  Normal axis and intervals.  No STEMI. ____________________________________________  RADIOLOGY  ED MD interpretation:  Ct pending  Official radiology report(s): No results found.  ____________________________________________   PROCEDURES and INTERVENTIONS  Procedure(s) performed (including Critical Care):  .1-3 Lead EKG Interpretation Performed by: Delton Prairie, MD Authorized by: Delton Prairie, MD     Interpretation: normal     ECG rate:  70   ECG rate assessment: normal     Rhythm: sinus rhythm     Ectopy: none     Conduction: normal   .Critical Care Performed by: Delton Prairie, MD Authorized by: Delton Prairie, MD   Critical care provider statement:    Critical care time (minutes):  30   Critical care was necessary to treat or prevent imminent or life-threatening deterioration of the following conditions:  Metabolic crisis   Critical care was time spent personally by me on the following activities:  Discussions with consultants, ordering and review of radiographic studies, ordering and review of laboratory studies, pulse oximetry, re-evaluation of patient's condition, ordering and performing treatments and interventions, review of old charts, evaluation of patient's response to treatment and examination of patient  Medications  HYDROmorphone (DILAUDID) injection 1 mg (1 mg Intravenous Given 02/25/21 0623)  lactated ringers bolus 1,000 mL (1,000 mLs Intravenous New Bag/Given  02/25/21 0648)  HYDROmorphone (DILAUDID) injection 0.5 mg (0.5 mg Intravenous Given 02/25/21 0648)    ____________________________________________   MDM / ED COURSE   55 year old woman with history of multiple laparoscopic procedures in the past presents to the ED with possible incarcerated ventral hernia requiring surgical evaluation.  She is clearly uncomfortable, crying out in pain and requiring fairly large doses of analgesics to get control of her pain.  She has a firm bulge to her ventral abdomen suspicious for incarcerated hernia, possibly an incisional hernia in the setting of previous laparoscopic surgeries for endometriosis.  Mild leukocytosis, and more dramatic lactic acidosis is noted.  Further corroborates this.  CT pending, but due to high clinical suspicion, surgery was paged.  Patient signed out to oncoming provider to follow-up on surgical evaluation and anticipate admission, possibly operative intervention.  Clinical Course as of 02/25/21 0711  Tue Feb 25, 2021  0629 Reassessed after dilaudid. Improving pain control. Cannot reduce mass. We talked about my suspicion for incarcerated hernia, need for CT and possibly surgery. She expresses understanding. [DS]  3825 Reassessed. Still cannot reduce after 2nd dose of dilaudid.  [DS]  0710 Signed out to oncoming provider. Surgery has been paged. Awaiting CT [DS]    Clinical Course User Index [DS] Delton Prairie, MD    ____________________________________________   FINAL CLINICAL IMPRESSION(S) / ED DIAGNOSES  Final diagnoses:  Incarcerated ventral hernia     ED Discharge Orders     None        Chelsia Serres Katrinka Blazing   Note:  This document was prepared using Dragon voice recognition software and may include unintentional dictation  errors.    Delton Prairie, MD 02/25/21 [phone]

## 2021-02-25 NOTE — ED Notes (Signed)
Report given to [facility], RN with same day surgery.

## 2021-02-25 NOTE — ED Notes (Signed)
Pt ambulatory to the restroom.  

## 2021-02-25 NOTE — Op Note (Signed)
Preoperative diagnosis: ventral hernia repair, incarcerated Postoperative diagnosis: same  Procedure:  Open ventral hernia repair with mesh  Anesthesia: GETA  Surgeon: Sung Amabile  Wound Classification: Clean  Specimen: none  Complications: None  Estimated Blood Loss: minimal  Indications:see HPI  Findings: 2.3cm x 2cm ventral hernia with incarcerated omentum Small bowel spontaneously reduced and viable 4. Tension free repair achieved with mesh and suture 5. Adequate hemostasis  Description of procedure: The patient was brought to the operating room and general anesthesia was induced. A time-out was completed verifying correct patient, procedure, site, positioning, and implant(s) and/or special equipment prior to beginning this procedure. Antibiotics were administered prior to making the incision. SCDs placed. The anterior abdominal wall was prepped and draped in the standard sterile fashion.   An incision was made over the obvious ventral hernia and. Dissection carried down to and around the large hernia sac, down tofascia where the umbilical stalk was noted.  Hernia sac then entered and noted to have multiple bands within it, with dense adhesions to viable omentum.  No sign of small bowel within the hernia sac on intial exam.  The adhesions between omentum and hernia sac removed and attempt made to reduce the omentum, but this proved unsuccessful through the small defect, so excess omental tissue was removed with endoseal.  Omental stump then reduced completely into abdominal cavity and excess hernia sac trimmed with endoseal.  Inspection of adjacent bowel through the defect noted chronic scarring at portions of the mesentary consistent with previous incarceration.  This portion the small bowel was viable.  The small bowel contents then reduced back into abdomen.  The 2.3 cm x 2cm defect was then repaired.  Attempt was made to create a retrorectus plane but her attenuated tissue made  this difficult through the small defect.  Preperitoneal space also difficult to clear for mesh, so decision made to place the mesh intra-peritoneal. A 8cm umbilical mesh was placed within the cavity through the present defect and secured in place on the abdominal wall using interrupted 0 Ethibond sutures, ensuring no bowel or omentum was caught within mesh sutures. Once the mesh was noted to be laying flat and secured to the abdominal wall the defect itself was primary closed using 0 Ethibond in a interrupted fashion.  The fascia as well as the skin incision was then covered with zynrelef. After confirming hemostasis, wound was irrigated and closed in a multilayer fashion, using multiple 3-0 Vicryl for the thick subcutaneous layer to minimize seroma formation, and skin closed with staples. Wound was then dressed with honeycomb dressing.  Patient was then successfully awakened and transferred to PACU in stable condition.  At the end of the procedure sponge and instrument counts were correct

## 2021-02-26 ENCOUNTER — Encounter: Payer: Self-pay | Admitting: Surgery

## 2021-02-26 LAB — CBC
HCT: 40.6 % (ref 36.0–46.0)
Hemoglobin: 13.4 g/dL (ref 12.0–15.0)
MCH: 29.9 pg (ref 26.0–34.0)
MCHC: 33 g/dL (ref 30.0–36.0)
MCV: 90.6 fL (ref 80.0–100.0)
Platelets: 269 10*3/uL (ref 150–400)
RBC: 4.48 MIL/uL (ref 3.87–5.11)
RDW: 12.8 % (ref 11.5–15.5)
WBC: 14.4 10*3/uL — ABNORMAL HIGH (ref 4.0–10.5)
nRBC: 0 % (ref 0.0–0.2)

## 2021-02-26 LAB — BASIC METABOLIC PANEL
Anion gap: 10 (ref 5–15)
BUN: 10 mg/dL (ref 6–20)
CO2: 25 mmol/L (ref 22–32)
Calcium: 8.8 mg/dL — ABNORMAL LOW (ref 8.9–10.3)
Chloride: 101 mmol/L (ref 98–111)
Creatinine, Ser: 0.78 mg/dL (ref 0.44–1.00)
GFR, Estimated: >60 mL/min (ref >60)
Glucose, Bld: 226 mg/dL — ABNORMAL HIGH (ref 70–99)
Potassium: 4 mmol/L (ref 3.5–5.1)
Sodium: 136 mmol/L (ref 135–145)

## 2021-02-26 MED ORDER — DOCUSATE SODIUM 100 MG PO CAPS: 100.0000 mg | ORAL_CAPSULE | Freq: Two times a day (BID) | ORAL | 0 refills | Status: AC | PRN

## 2021-02-26 MED ORDER — IBUPROFEN 800 MG PO TABS: 800.0000 mg | ORAL_TABLET | Freq: Three times a day (TID) | ORAL | 0 refills | Status: AC | PRN

## 2021-02-26 MED ORDER — TRAMADOL HCL 50 MG PO TABS: 50.0000 mg | ORAL_TABLET | Freq: Four times a day (QID) | ORAL | 0 refills | Status: AC | PRN

## 2021-02-26 MED ORDER — ACETAMINOPHEN 325 MG PO TABS: 650.0000 mg | ORAL_TABLET | Freq: Three times a day (TID) | ORAL | 0 refills | Status: AC | PRN

## 2021-02-26 NOTE — Discharge Instructions (Signed)
Hernia repair, Care After This sheet gives you information about how to care for yourself after your procedure. Your health care provider may also give you more specific instructions. If you have problems or questions, contact your health care provider. What can I expect after the procedure? After your procedure, it is common to have the following: Pain in your abdomen, especially in the incision areas. You will be given medicine to control the pain. Tiredness. This is a normal part of the recovery process. Your energy level will return to normal over the next several weeks. Changes in your bowel movements, such as constipation or needing to go more often. Talk with your health care provider about how to manage this. Follow these instructions at home: Medicines  tylenol and advil as needed for discomfort.  Please alternate between the two every four hours as needed for pain.    Use narcotics, if prescribed, only when tylenol and motrin is not enough to control pain.  325-650mg  every 8hrs to max of 3000mg /24hrs (including the 325mg  in every norco dose) for the tylenol.    Advil up to 800mg  per dose every 8hrs as needed for pain.   PLEASE RECORD NUMBER OF PILLS TAKEN UNTIL NEXT FOLLOW UP APPT.  THIS WILL HELP DETERMINE HOW READY YOU ARE TO BE RELEASED FROM ANY ACTIVITY RESTRICTIONS Do not drive or use heavy machinery while taking prescription pain medicine. Do not drink alcohol while taking prescription pain medicine.  Incision care    Ok to remove outer dressing tomorrow. Staple line does not need to be covered with 4x4 gauze unless it catches on clothes.   Do not wear tight clothing over the incisions. Tight clothing may rub and irritate the incision areas, which may cause the incisions to open. Do not take baths, swim, or use a hot tub until your health care provider approves. OK TO SHOWER tomorrow after removing outer bandage. Soap and water ok to run over staple line. Check your incision  area every day for signs of infection. Check for: More redness, swelling, or pain. More fluid or blood. Warmth. Pus or a bad smell. Activity Avoid lifting anything that is heavier than 10 lb (4.5 kg) for 4 weeks or until your health care provider says it is okay. No pushing/pulling greater than 30lbs You may resume normal activities as told by your health care provider. Ask your health care provider what activities are safe for you. Take rest breaks during the day as needed. Eating and drinking Follow instructions from your health care provider about what you can eat after surgery. To prevent or treat constipation while you are taking prescription pain medicine, your health care provider may recommend that you: Drink enough fluid to keep your urine clear or pale yellow. Take over-the-counter or prescription medicines. Eat foods that are high in fiber, such as fresh fruits and vegetables, whole grains, and beans. Limit foods that are high in fat and processed sugars, such as fried and sweet foods. General instructions Ask your health care provider when you will need an appointment to get your sutures or staples removed. Keep all follow-up visits as told by your health care provider. This is important. Contact a health care provider if: You have more redness, swelling, or pain around your incisions. You have more fluid or blood coming from the incisions. Your incisions feel warm to the touch. You have pus or a bad smell coming from your incisions or your dressing. You have a fever. You have an  incision that breaks open (edges not staying together) after sutures or staples have been removed. You develop a rash. You have chest pain or difficulty breathing. You have pain or swelling in your legs. You feel light-headed or you faint. Your abdomen swells (becomes distended). You have nausea or vomiting. You have blood in your stool (feces). This information is not intended to replace advice  given to you by your health care provider. Make sure you discuss any questions you have with your health care provider. Document Released: 10/24/2004 Document Revised: 12/24/2017 Document Reviewed: 01/06/2016 Elsevier Interactive Patient Education  2019 [employer].

## 2021-02-26 NOTE — TOC CM/SW Note (Signed)
Patient has orders to discharge home today. Chart reviewed. On room air. Has incision on abdomen (honeycomb). No TOC needs identified. CSW signing off.  Charlynn Court, CSW [phone]

## 2021-02-27 NOTE — Discharge Summary (Addendum)
Physician Discharge Summary  Patient ID: Stephanie Livingston MRN: [id number] DOB/AGE: 55/08/1965 55 y.o.  Admit date: 02/25/2021 Discharge date: 02/26/21  Admission Diagnoses: incarcerated ventral  hernia  Discharge Diagnoses:  Same as above  Discharged Condition: good  Hospital Course: admitted for above. Underwent surgery.  Please see op note for details.  Post op, recovered as expected.  At time of d/c, tolerating diet and pain controlled  Consults: None  Discharge Exam: Blood pressure 122/72, pulse 79, temperature 97.8 F (36.6 C), temperature source Oral, resp. rate 18, height 5\' 4"  (1.626 m), weight 99.8 kg, SpO2 98 %. General appearance: alert and cooperative GI: soft, no guarding, minimal TTP around stapled incision site, which is c/d/I.  Disposition:  Discharge disposition: 01-Home or Self Care        Allergies as of 02/26/2021       Reactions   Morphine And Related Anaphylaxis   Inderal [propranolol] Other (See Comments)   Halluciations        Medication List     STOP taking these medications    albuterol 108 (90 Base) MCG/ACT inhaler Commonly known as: VENTOLIN HFA   chlorpheniramine-HYDROcodone 10-8 MG/5ML Suer Commonly known as: Tussionex Pennkinetic ER       TAKE these medications    acetaminophen 325 MG tablet Commonly known as: Tylenol Take 2 tablets (650 mg total) by mouth every 8 (eight) hours as needed for mild pain.   docusate sodium 100 MG capsule Commonly known as: Colace Take 1 capsule (100 mg total) by mouth 2 (two) times daily as needed for up to 10 days for mild constipation.   ibuprofen 800 MG tablet Commonly known as: ADVIL Take 1 tablet (800 mg total) by mouth every 8 (eight) hours as needed for mild pain or moderate pain.   traMADol 50 MG tablet Commonly known as: Ultram Take 1 tablet (50 mg total) by mouth every 6 (six) hours as needed for up to 10 doses for severe pain.        Follow-up Information     [place],  Elexis Pollak, DO. Go on 03/05/2021.   Specialty: Surgery Why: post op wound check and staple removal 9am appointment list of medication and insur card Contact information: [address] [place] [phone]                  Total time spent arranging discharge was >57min. Signed: 31m 02/27/2021, 8:32 AM

## 2021-02-27 NOTE — Anesthesia Postprocedure Evaluation (Signed)
Anesthesia Post Note  Patient: Stephanie Livingston  Procedure(s) Performed: HERNIA REPAIR VENTRAL ADULT INSERTION OF MESH  Patient location during evaluation: PACU Anesthesia Type: General Level of consciousness: awake and alert Pain management: pain level controlled Vital Signs Assessment: post-procedure vital signs reviewed and stable Respiratory status: spontaneous breathing, nonlabored ventilation, respiratory function stable and patient connected to nasal cannula oxygen Cardiovascular status: blood pressure returned to baseline and stable Postop Assessment: no apparent nausea or vomiting Anesthetic complications: no   No notable events documented.   Last Vitals:  Vitals:   02/26/21 0700 02/26/21 1522  BP: 136/86 122/72  Pulse: 70 79  Resp: 18 18  Temp: 36.5 C 36.6 C  SpO2: 98% 98%    Last Pain:  Vitals:   02/26/21 1522  TempSrc: Oral  PainSc:                  Yevette Edwards

## 2022-05-19 ENCOUNTER — Other Ambulatory Visit: Payer: Self-pay | Admitting: Physician Assistant

## 2022-05-19 DIAGNOSIS — Z1231 Encounter for screening mammogram for malignant neoplasm of breast: Secondary | ICD-10-CM

## 2023-05-26 DIAGNOSIS — E119 Type 2 diabetes mellitus without complications: Secondary | ICD-10-CM | POA: Diagnosis not present

## 2023-05-26 DIAGNOSIS — I358 Other nonrheumatic aortic valve disorders: Secondary | ICD-10-CM | POA: Diagnosis not present

## 2023-05-26 DIAGNOSIS — Z0000 Encounter for general adult medical examination without abnormal findings: Secondary | ICD-10-CM | POA: Diagnosis not present

## 2023-05-26 DIAGNOSIS — E559 Vitamin D deficiency, unspecified: Secondary | ICD-10-CM | POA: Diagnosis not present

## 2023-05-26 DIAGNOSIS — Z83438 Family history of other disorder of lipoprotein metabolism and other lipidemia: Secondary | ICD-10-CM | POA: Diagnosis not present

## 2023-06-11 DIAGNOSIS — R011 Cardiac murmur, unspecified: Secondary | ICD-10-CM | POA: Diagnosis not present

## 2023-06-11 DIAGNOSIS — Z01818 Encounter for other preprocedural examination: Secondary | ICD-10-CM | POA: Diagnosis not present

## 2023-06-11 DIAGNOSIS — I10 Essential (primary) hypertension: Secondary | ICD-10-CM | POA: Diagnosis not present

## 2023-06-22 DIAGNOSIS — R011 Cardiac murmur, unspecified: Secondary | ICD-10-CM | POA: Diagnosis not present

## 2023-07-12 DIAGNOSIS — D124 Benign neoplasm of descending colon: Secondary | ICD-10-CM | POA: Diagnosis not present

## 2023-07-12 DIAGNOSIS — Z1211 Encounter for screening for malignant neoplasm of colon: Secondary | ICD-10-CM | POA: Diagnosis not present

## 2023-07-12 DIAGNOSIS — D122 Benign neoplasm of ascending colon: Secondary | ICD-10-CM | POA: Diagnosis not present

## 2023-08-03 DIAGNOSIS — Z1231 Encounter for screening mammogram for malignant neoplasm of breast: Secondary | ICD-10-CM | POA: Diagnosis not present

## 2023-08-25 DIAGNOSIS — M545 Low back pain, unspecified: Secondary | ICD-10-CM | POA: Diagnosis not present

## 2023-08-25 DIAGNOSIS — I10 Essential (primary) hypertension: Secondary | ICD-10-CM | POA: Diagnosis not present

## 2023-08-25 DIAGNOSIS — Z1331 Encounter for screening for depression: Secondary | ICD-10-CM | POA: Diagnosis not present

## 2023-08-25 DIAGNOSIS — E119 Type 2 diabetes mellitus without complications: Secondary | ICD-10-CM | POA: Diagnosis not present

## 2023-08-25 DIAGNOSIS — G8929 Other chronic pain: Secondary | ICD-10-CM | POA: Diagnosis not present

## 2023-11-29 DIAGNOSIS — E119 Type 2 diabetes mellitus without complications: Secondary | ICD-10-CM | POA: Diagnosis not present

## 2023-11-29 DIAGNOSIS — I10 Essential (primary) hypertension: Secondary | ICD-10-CM | POA: Diagnosis not present

## 2023-11-29 DIAGNOSIS — R011 Cardiac murmur, unspecified: Secondary | ICD-10-CM | POA: Diagnosis not present

## 2024-01-27 DIAGNOSIS — N6453 Retraction of nipple: Secondary | ICD-10-CM | POA: Diagnosis not present

## 2024-01-27 DIAGNOSIS — N6342 Unspecified lump in left breast, subareolar: Secondary | ICD-10-CM | POA: Diagnosis not present

## 2024-01-28 ENCOUNTER — Other Ambulatory Visit: Payer: Self-pay | Admitting: Nurse Practitioner

## 2024-01-28 DIAGNOSIS — N6342 Unspecified lump in left breast, subareolar: Secondary | ICD-10-CM

## 2024-02-01 ENCOUNTER — Inpatient Hospital Stay
Admission: RE | Admit: 2024-02-01 | Discharge: 2024-02-01 | Disposition: A | Payer: Self-pay | Source: Ambulatory Visit | Attending: Nurse Practitioner | Admitting: Nurse Practitioner

## 2024-02-01 ENCOUNTER — Inpatient Hospital Stay
Admission: RE | Admit: 2024-02-01 | Discharge: 2024-02-01 | Disposition: A | Discharge status: Home / Self Care | Payer: Self-pay | Source: Ambulatory Visit | Attending: Nurse Practitioner | Admitting: Nurse Practitioner

## 2024-02-01 ENCOUNTER — Other Ambulatory Visit: Payer: Self-pay | Admitting: *Deleted

## 2024-02-01 DIAGNOSIS — Z1231 Encounter for screening mammogram for malignant neoplasm of breast: Secondary | ICD-10-CM

## 2024-02-24 DIAGNOSIS — R92333 Mammographic heterogeneous density, bilateral breasts: Secondary | ICD-10-CM | POA: Diagnosis not present

## 2024-02-24 DIAGNOSIS — N6320 Unspecified lump in the left breast, unspecified quadrant: Secondary | ICD-10-CM | POA: Diagnosis not present

## 2024-02-24 DIAGNOSIS — N611 Abscess of the breast and nipple: Secondary | ICD-10-CM | POA: Diagnosis not present

## 2024-02-24 DIAGNOSIS — R92332 Mammographic heterogeneous density, left breast: Secondary | ICD-10-CM | POA: Diagnosis not present

## 2024-02-25 DIAGNOSIS — N611 Abscess of the breast and nipple: Secondary | ICD-10-CM | POA: Diagnosis not present

## 2024-03-06 DIAGNOSIS — M545 Low back pain, unspecified: Secondary | ICD-10-CM | POA: Diagnosis not present

## 2024-03-06 DIAGNOSIS — N6002 Solitary cyst of left breast: Secondary | ICD-10-CM | POA: Diagnosis not present

## 2024-03-06 DIAGNOSIS — I10 Essential (primary) hypertension: Secondary | ICD-10-CM | POA: Diagnosis not present

## 2024-03-06 DIAGNOSIS — E119 Type 2 diabetes mellitus without complications: Secondary | ICD-10-CM | POA: Diagnosis not present

## 2024-03-14 DIAGNOSIS — R928 Other abnormal and inconclusive findings on diagnostic imaging of breast: Secondary | ICD-10-CM | POA: Diagnosis not present

## 2024-03-14 DIAGNOSIS — N6002 Solitary cyst of left breast: Secondary | ICD-10-CM | POA: Diagnosis not present

## 2024-03-14 DIAGNOSIS — Z1239 Encounter for other screening for malignant neoplasm of breast: Secondary | ICD-10-CM | POA: Diagnosis not present

## 2024-03-14 DIAGNOSIS — R92333 Mammographic heterogeneous density, bilateral breasts: Secondary | ICD-10-CM | POA: Diagnosis not present

## 2024-03-14 DIAGNOSIS — Z9189 Other specified personal risk factors, not elsewhere classified: Secondary | ICD-10-CM | POA: Diagnosis not present

## 2024-03-24 ENCOUNTER — Encounter: Payer: Self-pay | Admitting: Surgery
